# Patient Record
Sex: Female | Born: 1950 | Race: White | Hispanic: No | Marital: Single
Health system: Southern US, Community
[De-identification: ages and names within clinical notes are randomized; demographics above are authoritative.]

---

## 2015-08-07 DIAGNOSIS — R4781 Slurred speech: Secondary | ICD-10-CM | POA: Diagnosis not present

## 2015-08-07 DIAGNOSIS — R531 Weakness: Secondary | ICD-10-CM | POA: Diagnosis not present

## 2015-08-07 DIAGNOSIS — G459 Transient cerebral ischemic attack, unspecified: Secondary | ICD-10-CM | POA: Diagnosis not present

## 2015-08-07 DIAGNOSIS — I639 Cerebral infarction, unspecified: Secondary | ICD-10-CM | POA: Diagnosis not present

## 2015-08-07 DIAGNOSIS — I6789 Other cerebrovascular disease: Secondary | ICD-10-CM | POA: Diagnosis not present

## 2015-08-08 DIAGNOSIS — I071 Rheumatic tricuspid insufficiency: Secondary | ICD-10-CM | POA: Diagnosis not present

## 2015-08-08 DIAGNOSIS — G459 Transient cerebral ischemic attack, unspecified: Secondary | ICD-10-CM | POA: Diagnosis not present

## 2015-08-08 DIAGNOSIS — R419 Unspecified symptoms and signs involving cognitive functions and awareness: Secondary | ICD-10-CM | POA: Diagnosis not present

## 2015-08-08 DIAGNOSIS — E119 Type 2 diabetes mellitus without complications: Secondary | ICD-10-CM | POA: Diagnosis not present

## 2015-08-08 DIAGNOSIS — R404 Transient alteration of awareness: Secondary | ICD-10-CM | POA: Diagnosis not present

## 2015-08-08 DIAGNOSIS — I519 Heart disease, unspecified: Secondary | ICD-10-CM | POA: Diagnosis not present

## 2015-08-08 DIAGNOSIS — E785 Hyperlipidemia, unspecified: Secondary | ICD-10-CM | POA: Diagnosis not present

## 2015-08-08 DIAGNOSIS — G629 Polyneuropathy, unspecified: Secondary | ICD-10-CM | POA: Diagnosis not present

## 2015-08-20 DIAGNOSIS — R202 Paresthesia of skin: Secondary | ICD-10-CM | POA: Diagnosis not present

## 2015-08-20 DIAGNOSIS — R201 Hypoesthesia of skin: Secondary | ICD-10-CM | POA: Diagnosis not present

## 2015-08-20 DIAGNOSIS — Z09 Encounter for follow-up examination after completed treatment for conditions other than malignant neoplasm: Secondary | ICD-10-CM | POA: Diagnosis not present

## 2015-08-20 DIAGNOSIS — M5481 Occipital neuralgia: Secondary | ICD-10-CM | POA: Diagnosis not present

## 2015-08-20 DIAGNOSIS — M5417 Radiculopathy, lumbosacral region: Secondary | ICD-10-CM | POA: Diagnosis not present

## 2015-08-20 DIAGNOSIS — G5601 Carpal tunnel syndrome, right upper limb: Secondary | ICD-10-CM | POA: Diagnosis not present

## 2015-08-20 DIAGNOSIS — G603 Idiopathic progressive neuropathy: Secondary | ICD-10-CM | POA: Diagnosis not present

## 2015-08-20 DIAGNOSIS — R260 Ataxic gait: Secondary | ICD-10-CM | POA: Diagnosis not present

## 2015-08-20 DIAGNOSIS — M5412 Radiculopathy, cervical region: Secondary | ICD-10-CM | POA: Diagnosis not present

## 2015-08-29 DIAGNOSIS — M47817 Spondylosis without myelopathy or radiculopathy, lumbosacral region: Secondary | ICD-10-CM | POA: Diagnosis not present

## 2015-08-29 DIAGNOSIS — M47816 Spondylosis without myelopathy or radiculopathy, lumbar region: Secondary | ICD-10-CM | POA: Diagnosis not present

## 2015-08-29 DIAGNOSIS — M47815 Spondylosis without myelopathy or radiculopathy, thoracolumbar region: Secondary | ICD-10-CM | POA: Diagnosis not present

## 2015-08-29 DIAGNOSIS — M5126 Other intervertebral disc displacement, lumbar region: Secondary | ICD-10-CM | POA: Diagnosis not present

## 2015-08-29 DIAGNOSIS — M5136 Other intervertebral disc degeneration, lumbar region: Secondary | ICD-10-CM | POA: Diagnosis not present

## 2015-08-29 DIAGNOSIS — M5125 Other intervertebral disc displacement, thoracolumbar region: Secondary | ICD-10-CM | POA: Diagnosis not present

## 2015-08-29 DIAGNOSIS — M5127 Other intervertebral disc displacement, lumbosacral region: Secondary | ICD-10-CM | POA: Diagnosis not present

## 2015-09-03 DIAGNOSIS — M5417 Radiculopathy, lumbosacral region: Secondary | ICD-10-CM | POA: Diagnosis not present

## 2015-09-03 DIAGNOSIS — G5601 Carpal tunnel syndrome, right upper limb: Secondary | ICD-10-CM | POA: Diagnosis not present

## 2015-09-03 DIAGNOSIS — M5412 Radiculopathy, cervical region: Secondary | ICD-10-CM | POA: Diagnosis not present

## 2015-09-03 DIAGNOSIS — G603 Idiopathic progressive neuropathy: Secondary | ICD-10-CM | POA: Diagnosis not present

## 2015-09-03 DIAGNOSIS — R201 Hypoesthesia of skin: Secondary | ICD-10-CM | POA: Diagnosis not present

## 2015-09-17 DIAGNOSIS — R404 Transient alteration of awareness: Secondary | ICD-10-CM | POA: Diagnosis not present

## 2015-09-17 DIAGNOSIS — G629 Polyneuropathy, unspecified: Secondary | ICD-10-CM | POA: Diagnosis not present

## 2015-09-25 DIAGNOSIS — M5442 Lumbago with sciatica, left side: Secondary | ICD-10-CM | POA: Diagnosis not present

## 2015-09-25 DIAGNOSIS — R201 Hypoesthesia of skin: Secondary | ICD-10-CM | POA: Diagnosis not present

## 2015-09-25 DIAGNOSIS — G603 Idiopathic progressive neuropathy: Secondary | ICD-10-CM | POA: Diagnosis not present

## 2015-09-25 DIAGNOSIS — M5441 Lumbago with sciatica, right side: Secondary | ICD-10-CM | POA: Diagnosis not present

## 2015-09-25 DIAGNOSIS — R202 Paresthesia of skin: Secondary | ICD-10-CM | POA: Diagnosis not present

## 2015-10-06 DIAGNOSIS — M961 Postlaminectomy syndrome, not elsewhere classified: Secondary | ICD-10-CM | POA: Diagnosis not present

## 2015-10-06 DIAGNOSIS — G894 Chronic pain syndrome: Secondary | ICD-10-CM | POA: Diagnosis not present

## 2015-10-06 DIAGNOSIS — M5417 Radiculopathy, lumbosacral region: Secondary | ICD-10-CM | POA: Diagnosis not present

## 2015-10-06 DIAGNOSIS — M792 Neuralgia and neuritis, unspecified: Secondary | ICD-10-CM | POA: Diagnosis not present

## 2015-11-04 DIAGNOSIS — Z0000 Encounter for general adult medical examination without abnormal findings: Secondary | ICD-10-CM | POA: Diagnosis not present

## 2015-11-04 DIAGNOSIS — M0579 Rheumatoid arthritis with rheumatoid factor of multiple sites without organ or systems involvement: Secondary | ICD-10-CM | POA: Diagnosis not present

## 2015-11-04 DIAGNOSIS — R5383 Other fatigue: Secondary | ICD-10-CM | POA: Diagnosis not present

## 2015-11-04 DIAGNOSIS — K219 Gastro-esophageal reflux disease without esophagitis: Secondary | ICD-10-CM | POA: Diagnosis not present

## 2015-11-04 DIAGNOSIS — E7800 Pure hypercholesterolemia, unspecified: Secondary | ICD-10-CM | POA: Diagnosis not present

## 2015-11-04 DIAGNOSIS — I10 Essential (primary) hypertension: Secondary | ICD-10-CM | POA: Diagnosis not present

## 2015-11-04 DIAGNOSIS — E1143 Type 2 diabetes mellitus with diabetic autonomic (poly)neuropathy: Secondary | ICD-10-CM | POA: Diagnosis not present

## 2015-11-06 DIAGNOSIS — G5601 Carpal tunnel syndrome, right upper limb: Secondary | ICD-10-CM | POA: Diagnosis not present

## 2015-11-06 DIAGNOSIS — M5442 Lumbago with sciatica, left side: Secondary | ICD-10-CM | POA: Diagnosis not present

## 2015-11-06 DIAGNOSIS — M5441 Lumbago with sciatica, right side: Secondary | ICD-10-CM | POA: Diagnosis not present

## 2015-11-06 DIAGNOSIS — G603 Idiopathic progressive neuropathy: Secondary | ICD-10-CM | POA: Diagnosis not present

## 2015-11-06 DIAGNOSIS — R201 Hypoesthesia of skin: Secondary | ICD-10-CM | POA: Diagnosis not present

## 2015-11-06 DIAGNOSIS — M5412 Radiculopathy, cervical region: Secondary | ICD-10-CM | POA: Diagnosis not present

## 2015-12-11 DIAGNOSIS — R609 Edema, unspecified: Secondary | ICD-10-CM | POA: Diagnosis not present

## 2015-12-11 DIAGNOSIS — E1143 Type 2 diabetes mellitus with diabetic autonomic (poly)neuropathy: Secondary | ICD-10-CM | POA: Diagnosis not present

## 2015-12-11 DIAGNOSIS — M791 Myalgia: Secondary | ICD-10-CM | POA: Diagnosis not present

## 2015-12-11 DIAGNOSIS — R600 Localized edema: Secondary | ICD-10-CM | POA: Diagnosis not present

## 2015-12-11 DIAGNOSIS — R531 Weakness: Secondary | ICD-10-CM | POA: Diagnosis not present

## 2015-12-11 DIAGNOSIS — M6281 Muscle weakness (generalized): Secondary | ICD-10-CM | POA: Diagnosis not present

## 2015-12-11 DIAGNOSIS — I10 Essential (primary) hypertension: Secondary | ICD-10-CM | POA: Diagnosis not present

## 2015-12-12 DIAGNOSIS — N179 Acute kidney failure, unspecified: Secondary | ICD-10-CM | POA: Diagnosis not present

## 2015-12-12 DIAGNOSIS — E785 Hyperlipidemia, unspecified: Secondary | ICD-10-CM | POA: Diagnosis not present

## 2015-12-12 DIAGNOSIS — E860 Dehydration: Secondary | ICD-10-CM | POA: Diagnosis not present

## 2015-12-12 DIAGNOSIS — K219 Gastro-esophageal reflux disease without esophagitis: Secondary | ICD-10-CM | POA: Diagnosis not present

## 2015-12-12 DIAGNOSIS — R269 Unspecified abnormalities of gait and mobility: Secondary | ICD-10-CM | POA: Diagnosis not present

## 2015-12-12 DIAGNOSIS — E1140 Type 2 diabetes mellitus with diabetic neuropathy, unspecified: Secondary | ICD-10-CM | POA: Diagnosis not present

## 2015-12-12 DIAGNOSIS — E1165 Type 2 diabetes mellitus with hyperglycemia: Secondary | ICD-10-CM | POA: Diagnosis not present

## 2015-12-12 DIAGNOSIS — R0602 Shortness of breath: Secondary | ICD-10-CM | POA: Diagnosis not present

## 2015-12-12 DIAGNOSIS — Z79899 Other long term (current) drug therapy: Secondary | ICD-10-CM | POA: Diagnosis not present

## 2015-12-12 DIAGNOSIS — Z7984 Long term (current) use of oral hypoglycemic drugs: Secondary | ICD-10-CM | POA: Diagnosis not present

## 2015-12-12 DIAGNOSIS — M7989 Other specified soft tissue disorders: Secondary | ICD-10-CM | POA: Diagnosis not present

## 2015-12-18 DIAGNOSIS — N179 Acute kidney failure, unspecified: Secondary | ICD-10-CM | POA: Diagnosis not present

## 2015-12-18 DIAGNOSIS — R739 Hyperglycemia, unspecified: Secondary | ICD-10-CM | POA: Diagnosis not present

## 2015-12-18 DIAGNOSIS — I10 Essential (primary) hypertension: Secondary | ICD-10-CM | POA: Diagnosis not present

## 2015-12-18 DIAGNOSIS — M6281 Muscle weakness (generalized): Secondary | ICD-10-CM | POA: Diagnosis not present

## 2015-12-18 DIAGNOSIS — E1143 Type 2 diabetes mellitus with diabetic autonomic (poly)neuropathy: Secondary | ICD-10-CM | POA: Diagnosis not present

## 2015-12-18 DIAGNOSIS — Z09 Encounter for follow-up examination after completed treatment for conditions other than malignant neoplasm: Secondary | ICD-10-CM | POA: Diagnosis not present

## 2015-12-18 DIAGNOSIS — Z23 Encounter for immunization: Secondary | ICD-10-CM | POA: Diagnosis not present

## 2015-12-29 DIAGNOSIS — M7138 Other bursal cyst, other site: Secondary | ICD-10-CM | POA: Diagnosis not present

## 2015-12-29 DIAGNOSIS — K5900 Constipation, unspecified: Secondary | ICD-10-CM | POA: Diagnosis not present

## 2015-12-29 DIAGNOSIS — K219 Gastro-esophageal reflux disease without esophagitis: Secondary | ICD-10-CM | POA: Diagnosis not present

## 2015-12-29 DIAGNOSIS — Z780 Asymptomatic menopausal state: Secondary | ICD-10-CM | POA: Diagnosis not present

## 2015-12-29 DIAGNOSIS — R1314 Dysphagia, pharyngoesophageal phase: Secondary | ICD-10-CM | POA: Diagnosis not present

## 2015-12-29 DIAGNOSIS — G629 Polyneuropathy, unspecified: Secondary | ICD-10-CM | POA: Diagnosis not present

## 2016-01-21 DIAGNOSIS — Z794 Long term (current) use of insulin: Secondary | ICD-10-CM | POA: Diagnosis not present

## 2016-01-21 DIAGNOSIS — E1169 Type 2 diabetes mellitus with other specified complication: Secondary | ICD-10-CM | POA: Diagnosis not present

## 2016-01-21 DIAGNOSIS — I10 Essential (primary) hypertension: Secondary | ICD-10-CM | POA: Diagnosis not present

## 2016-01-21 DIAGNOSIS — E785 Hyperlipidemia, unspecified: Secondary | ICD-10-CM | POA: Diagnosis not present

## 2016-01-21 DIAGNOSIS — E1165 Type 2 diabetes mellitus with hyperglycemia: Secondary | ICD-10-CM | POA: Diagnosis not present

## 2016-01-21 DIAGNOSIS — E1159 Type 2 diabetes mellitus with other circulatory complications: Secondary | ICD-10-CM | POA: Diagnosis not present

## 2016-01-21 DIAGNOSIS — E1140 Type 2 diabetes mellitus with diabetic neuropathy, unspecified: Secondary | ICD-10-CM | POA: Diagnosis not present

## 2016-01-23 DIAGNOSIS — R1314 Dysphagia, pharyngoesophageal phase: Secondary | ICD-10-CM | POA: Diagnosis not present

## 2016-01-23 DIAGNOSIS — K449 Diaphragmatic hernia without obstruction or gangrene: Secondary | ICD-10-CM | POA: Diagnosis not present

## 2016-01-23 DIAGNOSIS — K219 Gastro-esophageal reflux disease without esophagitis: Secondary | ICD-10-CM | POA: Diagnosis not present

## 2016-02-03 DIAGNOSIS — E785 Hyperlipidemia, unspecified: Secondary | ICD-10-CM | POA: Diagnosis not present

## 2016-02-03 DIAGNOSIS — K219 Gastro-esophageal reflux disease without esophagitis: Secondary | ICD-10-CM | POA: Diagnosis not present

## 2016-02-03 DIAGNOSIS — M5116 Intervertebral disc disorders with radiculopathy, lumbar region: Secondary | ICD-10-CM | POA: Diagnosis not present

## 2016-02-03 DIAGNOSIS — M5431 Sciatica, right side: Secondary | ICD-10-CM | POA: Diagnosis not present

## 2016-02-03 DIAGNOSIS — Z794 Long term (current) use of insulin: Secondary | ICD-10-CM | POA: Diagnosis not present

## 2016-02-03 DIAGNOSIS — Z79899 Other long term (current) drug therapy: Secondary | ICD-10-CM | POA: Diagnosis not present

## 2016-02-03 DIAGNOSIS — M545 Low back pain: Secondary | ICD-10-CM | POA: Diagnosis not present

## 2016-02-03 DIAGNOSIS — M48061 Spinal stenosis, lumbar region without neurogenic claudication: Secondary | ICD-10-CM | POA: Diagnosis not present

## 2016-02-03 DIAGNOSIS — M1990 Unspecified osteoarthritis, unspecified site: Secondary | ICD-10-CM | POA: Diagnosis not present

## 2016-02-03 DIAGNOSIS — Z9049 Acquired absence of other specified parts of digestive tract: Secondary | ICD-10-CM | POA: Diagnosis not present

## 2016-02-03 DIAGNOSIS — M5136 Other intervertebral disc degeneration, lumbar region: Secondary | ICD-10-CM | POA: Diagnosis not present

## 2016-02-03 DIAGNOSIS — M7138 Other bursal cyst, other site: Secondary | ICD-10-CM | POA: Diagnosis not present

## 2016-02-03 DIAGNOSIS — Z9889 Other specified postprocedural states: Secondary | ICD-10-CM | POA: Diagnosis not present

## 2016-02-03 DIAGNOSIS — E119 Type 2 diabetes mellitus without complications: Secondary | ICD-10-CM | POA: Diagnosis not present

## 2016-02-03 DIAGNOSIS — I10 Essential (primary) hypertension: Secondary | ICD-10-CM | POA: Diagnosis not present

## 2016-03-12 DIAGNOSIS — E7800 Pure hypercholesterolemia, unspecified: Secondary | ICD-10-CM | POA: Diagnosis not present

## 2016-03-12 DIAGNOSIS — E1143 Type 2 diabetes mellitus with diabetic autonomic (poly)neuropathy: Secondary | ICD-10-CM | POA: Diagnosis not present

## 2016-03-12 DIAGNOSIS — K219 Gastro-esophageal reflux disease without esophagitis: Secondary | ICD-10-CM | POA: Diagnosis not present

## 2016-03-12 DIAGNOSIS — I10 Essential (primary) hypertension: Secondary | ICD-10-CM | POA: Diagnosis not present

## 2016-05-25 DIAGNOSIS — H25813 Combined forms of age-related cataract, bilateral: Secondary | ICD-10-CM | POA: Diagnosis not present

## 2016-05-25 DIAGNOSIS — E119 Type 2 diabetes mellitus without complications: Secondary | ICD-10-CM | POA: Diagnosis not present

## 2016-08-16 DIAGNOSIS — I10 Essential (primary) hypertension: Secondary | ICD-10-CM | POA: Diagnosis not present

## 2016-08-16 DIAGNOSIS — E1165 Type 2 diabetes mellitus with hyperglycemia: Secondary | ICD-10-CM | POA: Diagnosis not present

## 2016-08-16 DIAGNOSIS — E1159 Type 2 diabetes mellitus with other circulatory complications: Secondary | ICD-10-CM | POA: Diagnosis not present

## 2016-08-16 DIAGNOSIS — E785 Hyperlipidemia, unspecified: Secondary | ICD-10-CM | POA: Diagnosis not present

## 2016-08-16 DIAGNOSIS — E1169 Type 2 diabetes mellitus with other specified complication: Secondary | ICD-10-CM | POA: Diagnosis not present

## 2016-08-16 DIAGNOSIS — Z794 Long term (current) use of insulin: Secondary | ICD-10-CM | POA: Diagnosis not present

## 2016-08-16 DIAGNOSIS — E1140 Type 2 diabetes mellitus with diabetic neuropathy, unspecified: Secondary | ICD-10-CM | POA: Diagnosis not present

## 2016-10-06 DIAGNOSIS — E1159 Type 2 diabetes mellitus with other circulatory complications: Secondary | ICD-10-CM | POA: Diagnosis not present

## 2016-10-06 DIAGNOSIS — M7732 Calcaneal spur, left foot: Secondary | ICD-10-CM | POA: Diagnosis not present

## 2016-10-06 DIAGNOSIS — M19072 Primary osteoarthritis, left ankle and foot: Secondary | ICD-10-CM | POA: Diagnosis not present

## 2016-10-06 DIAGNOSIS — I10 Essential (primary) hypertension: Secondary | ICD-10-CM | POA: Diagnosis not present

## 2016-10-06 DIAGNOSIS — S99922A Unspecified injury of left foot, initial encounter: Secondary | ICD-10-CM | POA: Diagnosis not present

## 2016-10-08 DIAGNOSIS — I10 Essential (primary) hypertension: Secondary | ICD-10-CM | POA: Diagnosis not present

## 2016-10-08 DIAGNOSIS — S93602A Unspecified sprain of left foot, initial encounter: Secondary | ICD-10-CM | POA: Diagnosis not present

## 2016-10-08 DIAGNOSIS — E1159 Type 2 diabetes mellitus with other circulatory complications: Secondary | ICD-10-CM | POA: Diagnosis not present

## 2016-10-27 DIAGNOSIS — E7800 Pure hypercholesterolemia, unspecified: Secondary | ICD-10-CM | POA: Diagnosis not present

## 2016-10-27 DIAGNOSIS — I10 Essential (primary) hypertension: Secondary | ICD-10-CM | POA: Diagnosis not present

## 2016-10-27 DIAGNOSIS — R1314 Dysphagia, pharyngoesophageal phase: Secondary | ICD-10-CM | POA: Diagnosis not present

## 2016-10-27 DIAGNOSIS — M0579 Rheumatoid arthritis with rheumatoid factor of multiple sites without organ or systems involvement: Secondary | ICD-10-CM | POA: Diagnosis not present

## 2016-10-27 DIAGNOSIS — R5383 Other fatigue: Secondary | ICD-10-CM | POA: Diagnosis not present

## 2016-10-27 DIAGNOSIS — Z0000 Encounter for general adult medical examination without abnormal findings: Secondary | ICD-10-CM | POA: Diagnosis not present

## 2016-10-27 DIAGNOSIS — E1140 Type 2 diabetes mellitus with diabetic neuropathy, unspecified: Secondary | ICD-10-CM | POA: Diagnosis not present

## 2016-10-27 DIAGNOSIS — Z23 Encounter for immunization: Secondary | ICD-10-CM | POA: Diagnosis not present

## 2016-11-01 DIAGNOSIS — Z794 Long term (current) use of insulin: Secondary | ICD-10-CM | POA: Diagnosis not present

## 2016-11-01 DIAGNOSIS — I10 Essential (primary) hypertension: Secondary | ICD-10-CM | POA: Diagnosis not present

## 2016-11-01 DIAGNOSIS — E1159 Type 2 diabetes mellitus with other circulatory complications: Secondary | ICD-10-CM | POA: Diagnosis not present

## 2016-11-01 DIAGNOSIS — E1169 Type 2 diabetes mellitus with other specified complication: Secondary | ICD-10-CM | POA: Diagnosis not present

## 2016-11-01 DIAGNOSIS — E785 Hyperlipidemia, unspecified: Secondary | ICD-10-CM | POA: Diagnosis not present

## 2016-11-01 DIAGNOSIS — E1140 Type 2 diabetes mellitus with diabetic neuropathy, unspecified: Secondary | ICD-10-CM | POA: Diagnosis not present

## 2016-11-01 DIAGNOSIS — E1165 Type 2 diabetes mellitus with hyperglycemia: Secondary | ICD-10-CM | POA: Diagnosis not present

## 2016-11-18 DIAGNOSIS — M0579 Rheumatoid arthritis with rheumatoid factor of multiple sites without organ or systems involvement: Secondary | ICD-10-CM | POA: Diagnosis not present

## 2017-01-17 DIAGNOSIS — H00021 Hordeolum internum right upper eyelid: Secondary | ICD-10-CM | POA: Diagnosis not present

## 2017-01-31 DIAGNOSIS — L253 Unspecified contact dermatitis due to other chemical products: Secondary | ICD-10-CM | POA: Diagnosis not present

## 2017-03-03 ENCOUNTER — Other Ambulatory Visit: Payer: Self-pay

## 2017-03-03 NOTE — Patient Outreach (Signed)
[facility]  03/03/2017  Christine FriendlyKay B Blake 07/27/1950 [id number]   Medication Adherence call to Mrs. Christine PickettKay Blake patient is showing past due under [facility]on two of her medication Lisinopril 40 mg and Simvastatin 20 mg spoke with patient she said she still has medication but is going to need some more in a couple of day she ask if we can contact [facility] mail order and order both medication for her ,call [facility] they will send out both medication patient should wait 5-7 business  days for then to deliver.   Christine AbedAna Blake CPhT Pharmacy Technician [facility] Care Management Direct Dial [phone]  Fax [phone] [email or web address]

## 2017-04-09 DIAGNOSIS — H109 Unspecified conjunctivitis: Secondary | ICD-10-CM | POA: Diagnosis not present

## 2017-04-12 DIAGNOSIS — J208 Acute bronchitis due to other specified organisms: Secondary | ICD-10-CM | POA: Diagnosis not present

## 2017-04-12 DIAGNOSIS — B9689 Other specified bacterial agents as the cause of diseases classified elsewhere: Secondary | ICD-10-CM | POA: Diagnosis not present

## 2017-04-29 DIAGNOSIS — K210 Gastro-esophageal reflux disease with esophagitis: Secondary | ICD-10-CM | POA: Diagnosis not present

## 2017-04-29 DIAGNOSIS — E1140 Type 2 diabetes mellitus with diabetic neuropathy, unspecified: Secondary | ICD-10-CM | POA: Diagnosis not present

## 2017-04-29 DIAGNOSIS — G5793 Unspecified mononeuropathy of bilateral lower limbs: Secondary | ICD-10-CM | POA: Diagnosis not present

## 2017-04-29 DIAGNOSIS — Z23 Encounter for immunization: Secondary | ICD-10-CM | POA: Diagnosis not present

## 2017-04-29 DIAGNOSIS — E7800 Pure hypercholesterolemia, unspecified: Secondary | ICD-10-CM | POA: Diagnosis not present

## 2017-04-29 DIAGNOSIS — I10 Essential (primary) hypertension: Secondary | ICD-10-CM | POA: Diagnosis not present

## 2017-05-24 DIAGNOSIS — E1159 Type 2 diabetes mellitus with other circulatory complications: Secondary | ICD-10-CM | POA: Diagnosis not present

## 2017-05-24 DIAGNOSIS — E785 Hyperlipidemia, unspecified: Secondary | ICD-10-CM | POA: Diagnosis not present

## 2017-05-24 DIAGNOSIS — Z794 Long term (current) use of insulin: Secondary | ICD-10-CM | POA: Diagnosis not present

## 2017-05-24 DIAGNOSIS — E1140 Type 2 diabetes mellitus with diabetic neuropathy, unspecified: Secondary | ICD-10-CM | POA: Diagnosis not present

## 2017-05-24 DIAGNOSIS — I10 Essential (primary) hypertension: Secondary | ICD-10-CM | POA: Diagnosis not present

## 2017-05-24 DIAGNOSIS — E1169 Type 2 diabetes mellitus with other specified complication: Secondary | ICD-10-CM | POA: Diagnosis not present

## 2017-05-24 DIAGNOSIS — E1165 Type 2 diabetes mellitus with hyperglycemia: Secondary | ICD-10-CM | POA: Diagnosis not present

## 2017-06-23 DIAGNOSIS — G894 Chronic pain syndrome: Secondary | ICD-10-CM | POA: Diagnosis not present

## 2017-06-23 DIAGNOSIS — E1159 Type 2 diabetes mellitus with other circulatory complications: Secondary | ICD-10-CM | POA: Diagnosis not present

## 2017-06-23 DIAGNOSIS — M5417 Radiculopathy, lumbosacral region: Secondary | ICD-10-CM | POA: Diagnosis not present

## 2017-06-23 DIAGNOSIS — I10 Essential (primary) hypertension: Secondary | ICD-10-CM | POA: Diagnosis not present

## 2017-06-23 DIAGNOSIS — M792 Neuralgia and neuritis, unspecified: Secondary | ICD-10-CM | POA: Diagnosis not present

## 2017-09-08 DIAGNOSIS — H2513 Age-related nuclear cataract, bilateral: Secondary | ICD-10-CM | POA: Diagnosis not present

## 2017-09-08 DIAGNOSIS — H524 Presbyopia: Secondary | ICD-10-CM | POA: Diagnosis not present

## 2017-09-12 DIAGNOSIS — E1169 Type 2 diabetes mellitus with other specified complication: Secondary | ICD-10-CM | POA: Diagnosis not present

## 2017-09-12 DIAGNOSIS — E1165 Type 2 diabetes mellitus with hyperglycemia: Secondary | ICD-10-CM | POA: Diagnosis not present

## 2017-09-12 DIAGNOSIS — I10 Essential (primary) hypertension: Secondary | ICD-10-CM | POA: Diagnosis not present

## 2017-09-12 DIAGNOSIS — Z794 Long term (current) use of insulin: Secondary | ICD-10-CM | POA: Diagnosis not present

## 2017-09-12 DIAGNOSIS — E1140 Type 2 diabetes mellitus with diabetic neuropathy, unspecified: Secondary | ICD-10-CM | POA: Diagnosis not present

## 2017-09-12 DIAGNOSIS — E1159 Type 2 diabetes mellitus with other circulatory complications: Secondary | ICD-10-CM | POA: Diagnosis not present

## 2017-09-12 DIAGNOSIS — E785 Hyperlipidemia, unspecified: Secondary | ICD-10-CM | POA: Diagnosis not present

## 2017-11-24 DIAGNOSIS — I10 Essential (primary) hypertension: Secondary | ICD-10-CM | POA: Diagnosis not present

## 2017-11-24 DIAGNOSIS — K210 Gastro-esophageal reflux disease with esophagitis: Secondary | ICD-10-CM | POA: Diagnosis not present

## 2017-11-24 DIAGNOSIS — Z0000 Encounter for general adult medical examination without abnormal findings: Secondary | ICD-10-CM | POA: Diagnosis not present

## 2017-11-24 DIAGNOSIS — G479 Sleep disorder, unspecified: Secondary | ICD-10-CM | POA: Diagnosis not present

## 2017-11-24 DIAGNOSIS — E7800 Pure hypercholesterolemia, unspecified: Secondary | ICD-10-CM | POA: Diagnosis not present

## 2017-11-24 DIAGNOSIS — Z23 Encounter for immunization: Secondary | ICD-10-CM | POA: Diagnosis not present

## 2017-11-24 DIAGNOSIS — R5383 Other fatigue: Secondary | ICD-10-CM | POA: Diagnosis not present

## 2018-02-06 DIAGNOSIS — E785 Hyperlipidemia, unspecified: Secondary | ICD-10-CM | POA: Diagnosis not present

## 2018-02-06 DIAGNOSIS — Z794 Long term (current) use of insulin: Secondary | ICD-10-CM | POA: Diagnosis not present

## 2018-02-06 DIAGNOSIS — E1159 Type 2 diabetes mellitus with other circulatory complications: Secondary | ICD-10-CM | POA: Diagnosis not present

## 2018-02-06 DIAGNOSIS — E1140 Type 2 diabetes mellitus with diabetic neuropathy, unspecified: Secondary | ICD-10-CM | POA: Diagnosis not present

## 2018-02-06 DIAGNOSIS — E1169 Type 2 diabetes mellitus with other specified complication: Secondary | ICD-10-CM | POA: Diagnosis not present

## 2018-02-06 DIAGNOSIS — I10 Essential (primary) hypertension: Secondary | ICD-10-CM | POA: Diagnosis not present

## 2018-02-06 DIAGNOSIS — E1165 Type 2 diabetes mellitus with hyperglycemia: Secondary | ICD-10-CM | POA: Diagnosis not present

## 2018-03-08 DIAGNOSIS — Z1231 Encounter for screening mammogram for malignant neoplasm of breast: Secondary | ICD-10-CM | POA: Diagnosis not present

## 2018-09-12 DIAGNOSIS — M25572 Pain in left ankle and joints of left foot: Secondary | ICD-10-CM | POA: Diagnosis not present

## 2018-09-12 DIAGNOSIS — M79672 Pain in left foot: Secondary | ICD-10-CM | POA: Diagnosis not present

## 2018-09-12 DIAGNOSIS — M79605 Pain in left leg: Secondary | ICD-10-CM | POA: Diagnosis not present

## 2018-09-12 DIAGNOSIS — S8253XA Displaced fracture of medial malleolus of unspecified tibia, initial encounter for closed fracture: Secondary | ICD-10-CM | POA: Diagnosis not present

## 2018-09-21 DIAGNOSIS — I10 Essential (primary) hypertension: Secondary | ICD-10-CM | POA: Diagnosis not present

## 2018-09-21 DIAGNOSIS — E7800 Pure hypercholesterolemia, unspecified: Secondary | ICD-10-CM | POA: Diagnosis not present

## 2018-09-21 DIAGNOSIS — E1140 Type 2 diabetes mellitus with diabetic neuropathy, unspecified: Secondary | ICD-10-CM | POA: Diagnosis not present

## 2018-09-22 DIAGNOSIS — M79672 Pain in left foot: Secondary | ICD-10-CM | POA: Diagnosis not present

## 2018-09-22 DIAGNOSIS — S99922S Unspecified injury of left foot, sequela: Secondary | ICD-10-CM | POA: Diagnosis not present

## 2018-09-26 DIAGNOSIS — M25572 Pain in left ankle and joints of left foot: Secondary | ICD-10-CM | POA: Diagnosis not present

## 2018-09-26 DIAGNOSIS — S93402A Sprain of unspecified ligament of left ankle, initial encounter: Secondary | ICD-10-CM | POA: Diagnosis not present

## 2018-10-18 ENCOUNTER — Other Ambulatory Visit: Payer: Self-pay

## 2018-10-18 NOTE — Patient Outreach (Signed)
[facility]) Care Management  10/18/2018  JALESA THIEN [birth year] [id number]   Medication Adherence call to Mrs. Shandelle Borrelli HIPPA Compliant Voice message left with a call back number. Mrs. Scatena is showing past due on Simvastatin 20 mg under [place].   [place] Management Direct Dial [phone]  Fax [phone] [email or web address]

## 2019-01-25 ENCOUNTER — Other Ambulatory Visit: Payer: Self-pay

## 2019-01-25 NOTE — Patient Outreach (Signed)
[facility]) Care Management  01/25/2019  ANISSIA WESSELLS 01/31/1951 [id number]   Medication Adherence call to Mrs. Nieshia Larmon HIPPA Compliant Voice message left with a call back number.Mrs. Goeden is showing past due on Simvastatin 20 mg and Lisinopril 20 mg under [place].   [place] Management Direct Dial [phone]  Fax [phone] [email or web address]

## 2019-01-29 ENCOUNTER — Other Ambulatory Visit: Payer: Self-pay

## 2019-01-29 NOTE — Patient Outreach (Signed)
[facility]) Care Management  01/29/2019  Christine Blake 07/14/1950 [id number]   Medication Adherence call to Mrs. Christine Blake HIPPA Compliant Voice message left with a call back number. Christine Blake is showing past due on Simvastatin 20 mg and Lisinopril 20 mg under [place].   [place] Management Direct Dial [phone]  Fax [phone] [email or web address]

## 2019-04-11 DIAGNOSIS — Z03818 Encounter for observation for suspected exposure to other biological agents ruled out: Secondary | ICD-10-CM | POA: Diagnosis not present

## 2019-04-18 DIAGNOSIS — H00024 Hordeolum internum left upper eyelid: Secondary | ICD-10-CM | POA: Diagnosis not present

## 2019-06-27 DIAGNOSIS — I10 Essential (primary) hypertension: Secondary | ICD-10-CM | POA: Diagnosis not present

## 2019-06-27 DIAGNOSIS — Z794 Long term (current) use of insulin: Secondary | ICD-10-CM | POA: Diagnosis not present

## 2019-06-27 DIAGNOSIS — E1140 Type 2 diabetes mellitus with diabetic neuropathy, unspecified: Secondary | ICD-10-CM | POA: Diagnosis not present

## 2019-07-11 ENCOUNTER — Other Ambulatory Visit: Payer: Self-pay

## 2019-07-11 ENCOUNTER — Other Ambulatory Visit: Payer: Self-pay | Admitting: Unknown Physician Specialty

## 2019-07-11 ENCOUNTER — Ambulatory Visit (INDEPENDENT_AMBULATORY_CARE_PROVIDER_SITE_OTHER): Payer: Medicare Other

## 2019-07-11 DIAGNOSIS — R1032 Left lower quadrant pain: Secondary | ICD-10-CM

## 2019-07-11 DIAGNOSIS — I10 Essential (primary) hypertension: Secondary | ICD-10-CM | POA: Diagnosis not present

## 2019-07-11 DIAGNOSIS — E7800 Pure hypercholesterolemia, unspecified: Secondary | ICD-10-CM | POA: Diagnosis not present

## 2019-07-11 DIAGNOSIS — R509 Fever, unspecified: Secondary | ICD-10-CM | POA: Diagnosis not present

## 2019-07-11 DIAGNOSIS — E1140 Type 2 diabetes mellitus with diabetic neuropathy, unspecified: Secondary | ICD-10-CM | POA: Diagnosis not present

## 2019-07-11 DIAGNOSIS — E1169 Type 2 diabetes mellitus with other specified complication: Secondary | ICD-10-CM | POA: Diagnosis not present

## 2019-07-19 DIAGNOSIS — R1032 Left lower quadrant pain: Secondary | ICD-10-CM | POA: Diagnosis not present

## 2019-10-16 DIAGNOSIS — Z794 Long term (current) use of insulin: Secondary | ICD-10-CM | POA: Diagnosis not present

## 2019-10-16 DIAGNOSIS — E1140 Type 2 diabetes mellitus with diabetic neuropathy, unspecified: Secondary | ICD-10-CM | POA: Diagnosis not present

## 2019-10-16 DIAGNOSIS — E1165 Type 2 diabetes mellitus with hyperglycemia: Secondary | ICD-10-CM | POA: Diagnosis not present

## 2019-11-17 DIAGNOSIS — B349 Viral infection, unspecified: Secondary | ICD-10-CM | POA: Diagnosis not present

## 2019-11-17 DIAGNOSIS — Z03818 Encounter for observation for suspected exposure to other biological agents ruled out: Secondary | ICD-10-CM | POA: Diagnosis not present

## 2019-11-21 DIAGNOSIS — Z20822 Contact with and (suspected) exposure to covid-19: Secondary | ICD-10-CM | POA: Diagnosis not present

## 2019-11-21 DIAGNOSIS — R42 Dizziness and giddiness: Secondary | ICD-10-CM | POA: Diagnosis not present

## 2019-11-21 DIAGNOSIS — J069 Acute upper respiratory infection, unspecified: Secondary | ICD-10-CM | POA: Diagnosis not present

## 2021-09-23 IMAGING — DX DG ABDOMEN 2V
3 series · 3 of 3 positions shown · non-contrast
Comparison: None.

CLINICAL DATA: Acute left lower quadrant abdominal pain.

EXAM:
ABDOMEN - 2 VIEW

[abdomen erect]
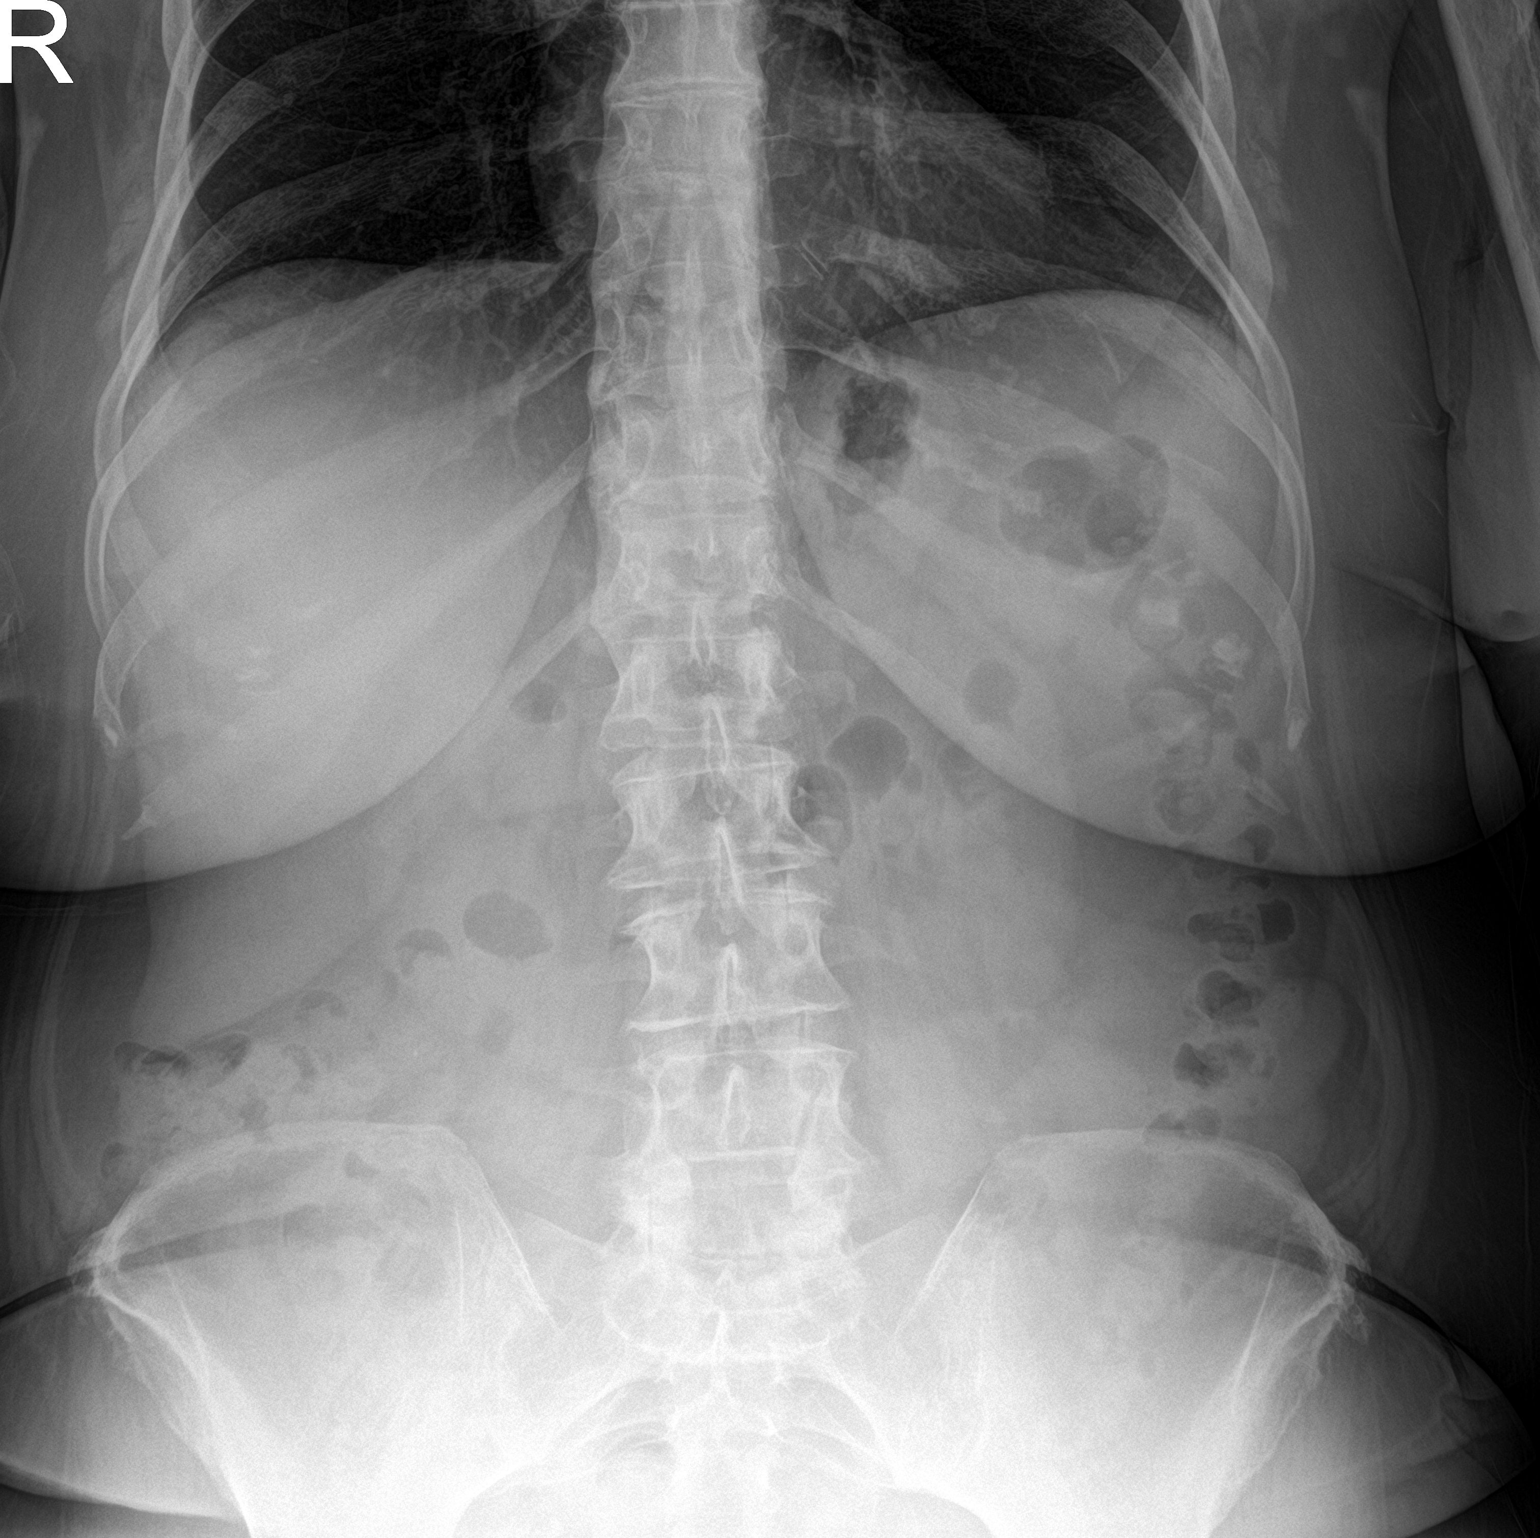

[abdomen supine (1 of 2)]
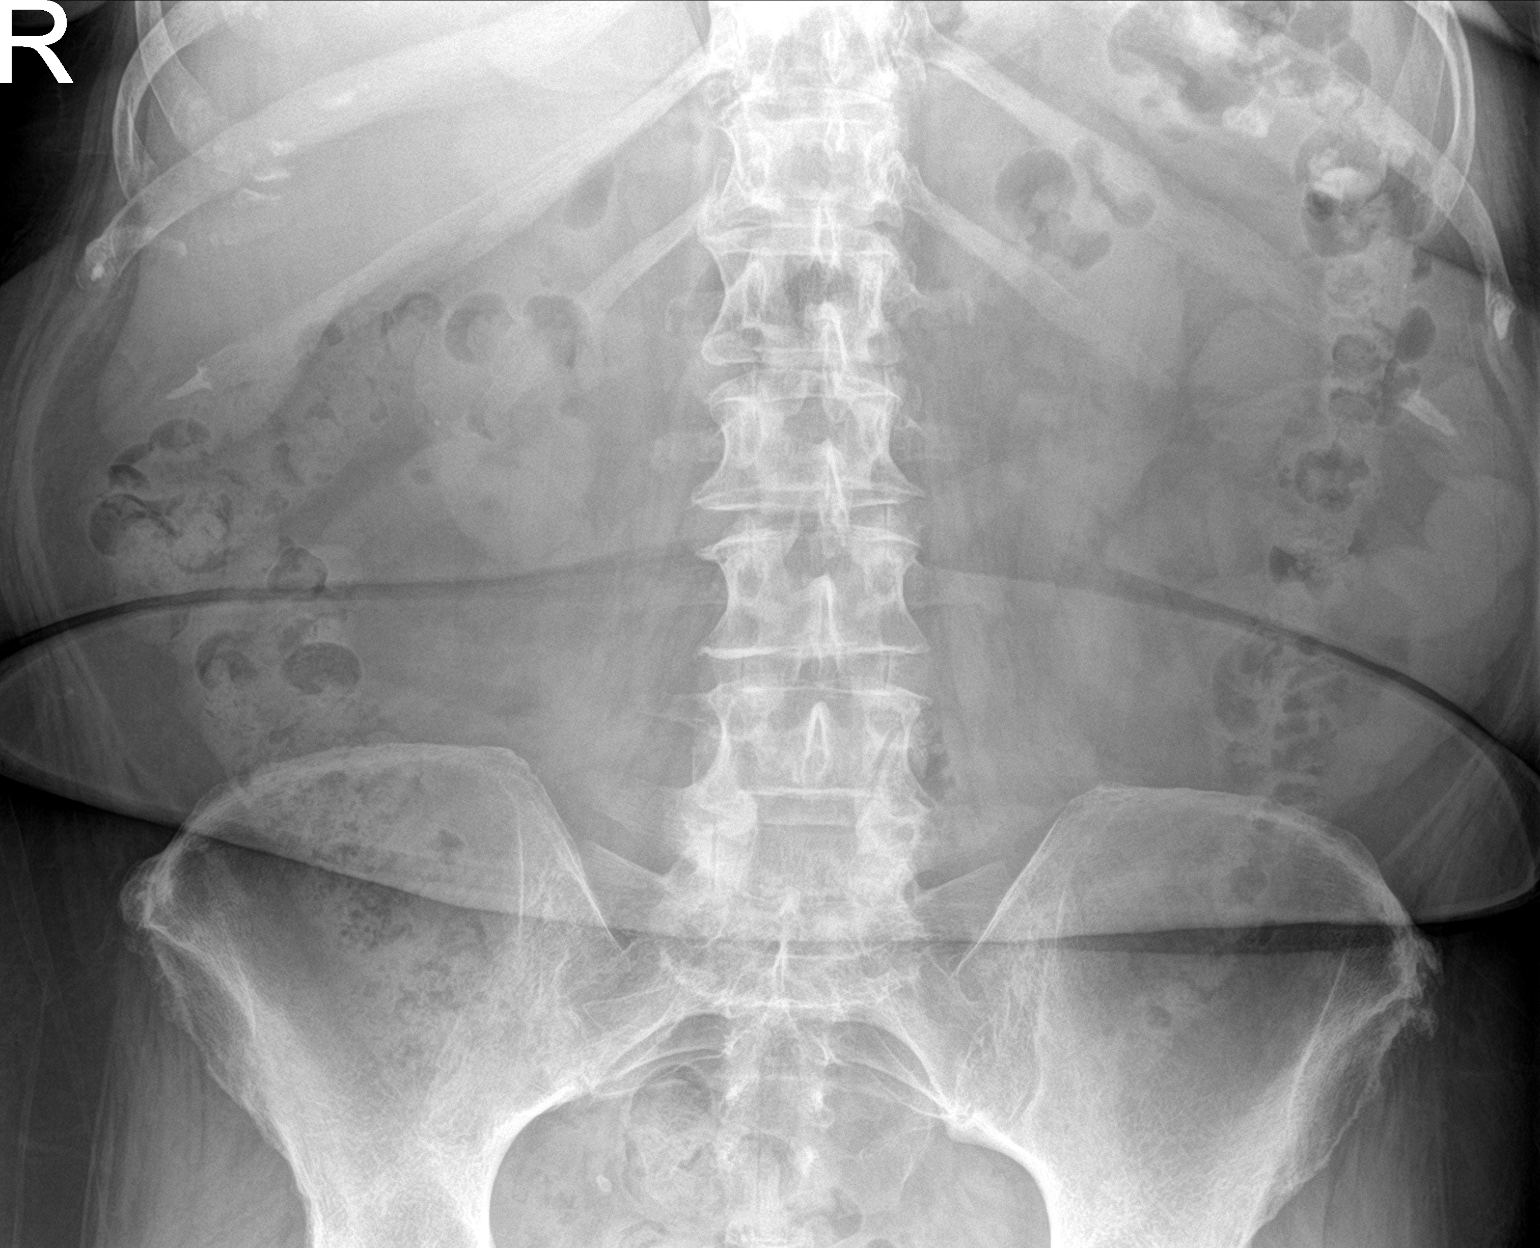

[abdomen supine (2 of 2)]
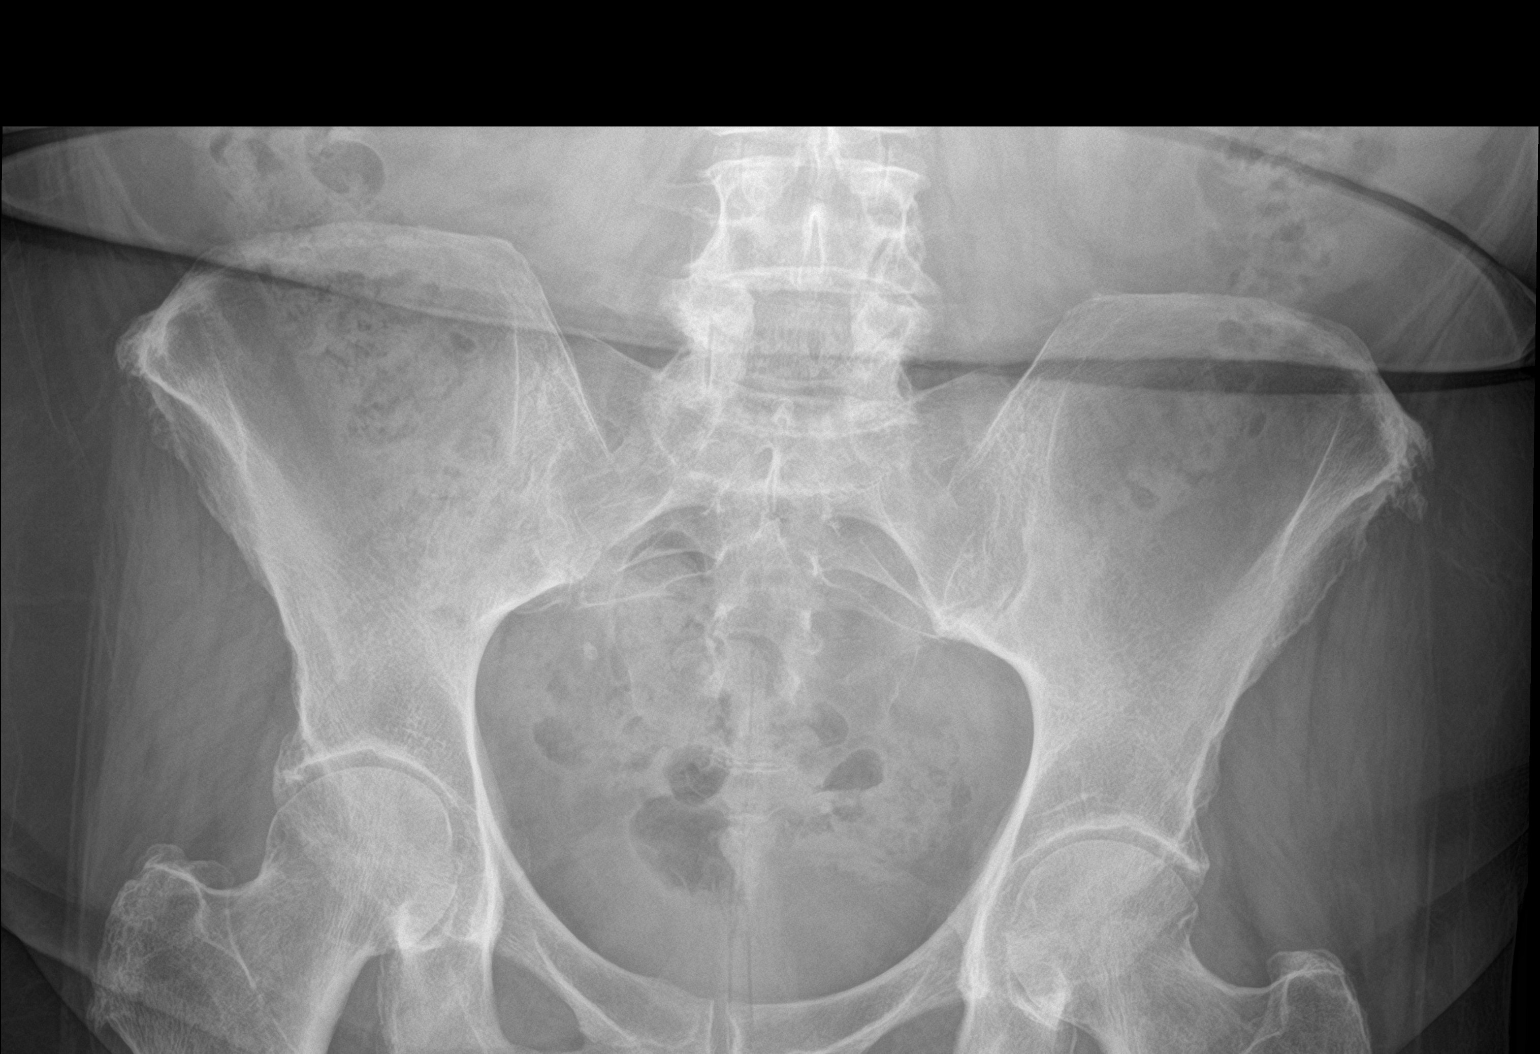

[3 of 3 positions shown; findings below may reference images not displayed]

FINDINGS: The bowel gas pattern is normal. Moderate amount of stool seen
throughout the colon. There is no evidence of free air. No
radio-opaque calculi or other significant radiographic abnormality
is seen.
IMPRESSION: Moderate stool burden. No evidence of bowel obstruction or ileus.
